# Patient Record
Sex: Male | Born: 1980 | Hispanic: No | Marital: Married | Smoking: Never smoker
Health system: Southern US, Community
[De-identification: ages and names within clinical notes are randomized; demographics above are authoritative.]

## PROBLEM LIST (undated history)

## (undated) DIAGNOSIS — G4730 Sleep apnea, unspecified: Secondary | ICD-10-CM

## (undated) DIAGNOSIS — Z87442 Personal history of urinary calculi: Secondary | ICD-10-CM

## (undated) HISTORY — PX: NO PAST SURGERIES: SHX2092

## (undated) HISTORY — PX: OTHER SURGICAL HISTORY: SHX169

---

## 2017-02-03 ENCOUNTER — Ambulatory Visit: Payer: Self-pay | Admitting: Podiatry

## 2017-03-19 ENCOUNTER — Other Ambulatory Visit: Payer: Self-pay | Admitting: Family Medicine

## 2017-03-19 DIAGNOSIS — R109 Unspecified abdominal pain: Secondary | ICD-10-CM

## 2017-03-24 ENCOUNTER — Ambulatory Visit
Admission: RE | Admit: 2017-03-24 | Discharge: 2017-03-24 | Disposition: A | Discharge status: Home / Self Care | Payer: Medicaid Other | Source: Ambulatory Visit | Attending: Family Medicine | Admitting: Family Medicine

## 2017-03-24 DIAGNOSIS — R109 Unspecified abdominal pain: Secondary | ICD-10-CM

## 2018-03-03 ENCOUNTER — Other Ambulatory Visit: Payer: Self-pay | Admitting: Gastroenterology

## 2018-03-03 DIAGNOSIS — K5909 Other constipation: Secondary | ICD-10-CM

## 2018-03-03 DIAGNOSIS — R109 Unspecified abdominal pain: Secondary | ICD-10-CM

## 2018-03-06 ENCOUNTER — Other Ambulatory Visit (HOSPITAL_BASED_OUTPATIENT_CLINIC_OR_DEPARTMENT_OTHER): Payer: Self-pay

## 2018-03-06 DIAGNOSIS — R0683 Snoring: Secondary | ICD-10-CM

## 2018-03-06 DIAGNOSIS — G4730 Sleep apnea, unspecified: Secondary | ICD-10-CM

## 2018-03-06 DIAGNOSIS — G4710 Hypersomnia, unspecified: Secondary | ICD-10-CM

## 2018-03-16 ENCOUNTER — Other Ambulatory Visit: Payer: Self-pay

## 2018-03-18 ENCOUNTER — Ambulatory Visit (HOSPITAL_BASED_OUTPATIENT_CLINIC_OR_DEPARTMENT_OTHER): Payer: No Typology Code available for payment source | Attending: Internal Medicine | Admitting: Internal Medicine

## 2018-03-18 VITALS — Ht 64.0 in | Wt 160.0 lb

## 2018-03-18 DIAGNOSIS — G4710 Hypersomnia, unspecified: Secondary | ICD-10-CM

## 2018-03-18 DIAGNOSIS — G4733 Obstructive sleep apnea (adult) (pediatric): Secondary | ICD-10-CM | POA: Insufficient documentation

## 2018-03-18 DIAGNOSIS — R0683 Snoring: Secondary | ICD-10-CM | POA: Diagnosis present

## 2018-03-18 DIAGNOSIS — G4730 Sleep apnea, unspecified: Secondary | ICD-10-CM

## 2018-03-18 DIAGNOSIS — R0681 Apnea, not elsewhere classified: Secondary | ICD-10-CM

## 2018-03-22 NOTE — Procedures (Signed)
   NAME: Jeff FeelerMajed T Carlo DATE OF BIRTH:  May 26, 1981 MEDICAL RECORD NUMBER [id number]  LOCATION: [facility]  PHYSICIAN: Deretha EmoryJames C Imojean Yoshino  DATE OF STUDY: 03/18/2018  SLEEP STUDY TYPE: Nocturnal Polysomnogram               REFERRING PHYSICIAN: Deretha Emorysborne, Loyola Santino C, MD  INDICATION FOR STUDY: excessive daytime sleepiness, witnessed apnea  EPWORTH SLEEPINESS SCORE:  NA HEIGHT: 5\' 4"  (162.6 cm)  WEIGHT: 160 lb (72.6 kg)    Body mass index is 27.46 kg/m.  NECK SIZE: 16 in.  MEDICATIONS  Patient self administered medications include: N/A. Medications administered during study include No sleep medicine administered.Marland Kitchen.   SLEEP STUDY TECHNIQUE  A multi-channel overnight Polysomnography study was performed. The channels recorded and monitored were central and occipital EEG, electrooculogram (EOG), submentalis EMG (chin), nasal and oral airflow, thoracic and abdominal wall motion, anterior tibialis EMG, snore microphone, electrocardiogram, and a pulse oximetry.   TECHNICAL COMMENTS  Comments added by Technician: NONE Comments added by Scorer: N/A   SLEEP ARCHITECTURE  The study was initiated at 9:48:54 PM and terminated at 5:00:16 AM. The total recorded time was 431.4 minutes. EEG confirmed total sleep time was 378 minutes yielding a sleep efficiency of 87.6%%. Sleep onset after lights out was 10.5 minutes with a REM latency of 5.0 minutes. The patient spent 9.1%% of the night in stage N1 sleep, 59.5%% in stage N2 sleep, 0.0%% in stage N3 and 31.35% in REM. Wake after sleep onset (WASO) was 42.8 minutes. The Arousal Index was 24.6/hour.   RESPIRATORY PARAMETERS  There were a total of 45 respiratory disturbances out of which 0 were apneas ( 0 obstructive, 0 mixed, 0 central) and 45 hypopneas. The apnea/hypopnea index (AHI) was 7.1 events/hour. The central sleep apnea index was 0.0 events/hour. The REM AHI was 19.2 events/hour. RDI was 15/hr overall and 29/hr in REM sleep.  Respiratory disturbances were associated with oxygen desaturation down to a nadir of 89.0% during sleep. The mean oxygen saturation during the study was 93.8%. The cumulative time under 88% oxygen saturation was 5.5 minutes.  LEG MOVEMENT DATA  The total leg movements were 0 with a resulting leg movement index of 0.0/hr . Associated arousal with leg movement index was 0.0/hr.   CARDIAC DATA  The underlying cardiac rhythm was most consistent with sinus rhythm. Mean heart rate during sleep was 80.0 bpm. Additional rhythm abnormalities include None.   IMPRESSIONS  Mild Obstructive Sleep apnea(OSA) No Significant Central Sleep Apnea (CSA) No significant periodic leg movements(PLMs) during sleep.  Early REM period (at 5 minutes of sleep)  DIAGNOSIS  Obstructive Sleep Apnea (327.23 [G47.33 ICD-10])  RECOMMENDATIONS  MIld obstructive sleep apnea. Consider treatment. However, if symptoms persistent, consider further testing due to presence of early REM period.   Deretha EmoryJames C Maham Quintin Sleep specialist, American Board of Internal Medicine  ELECTRONICALLY SIGNED ON:  03/22/2018, 3:17 PM [facility] PH: [phone]   FX: [phone] ACCREDITED BY THE AMERICAN ACADEMY OF SLEEP MEDICINE

## 2018-03-24 ENCOUNTER — Ambulatory Visit
Admission: RE | Admit: 2018-03-24 | Discharge: 2018-03-24 | Disposition: A | Discharge status: Home / Self Care | Payer: No Typology Code available for payment source | Source: Ambulatory Visit | Attending: Gastroenterology | Admitting: Gastroenterology

## 2018-03-24 DIAGNOSIS — R109 Unspecified abdominal pain: Secondary | ICD-10-CM

## 2018-03-24 DIAGNOSIS — K5909 Other constipation: Secondary | ICD-10-CM

## 2018-03-24 MED ORDER — IOPAMIDOL (ISOVUE-300) INJECTION 61%
100.0000 mL | Freq: Once | INTRAVENOUS | Status: AC | PRN
  Administered 2018-03-24: 100 mL via INTRAVENOUS

## 2018-05-12 MED FILL — DICYCLOMINE 10 MG CAPSULE: 10 | 15 days supply | Qty: 90 | Fill #0

## 2018-05-12 MED FILL — HYDROCORTISONE ACETATE 25 M: 25 | 30 days supply | Qty: 30 | Fill #0

## 2018-05-28 MED FILL — MARY'S MAGIC MOUTHWASH TCN: 12 days supply | Qty: 250 | Fill #0

## 2018-12-21 ENCOUNTER — Ambulatory Visit: Payer: Self-pay | Admitting: Surgery

## 2018-12-21 DIAGNOSIS — R1031 Right lower quadrant pain: Secondary | ICD-10-CM

## 2018-12-21 DIAGNOSIS — G8929 Other chronic pain: Secondary | ICD-10-CM | POA: Insufficient documentation

## 2018-12-21 DIAGNOSIS — R1010 Upper abdominal pain, unspecified: Secondary | ICD-10-CM | POA: Insufficient documentation

## 2018-12-21 DIAGNOSIS — K642 Third degree hemorrhoids: Secondary | ICD-10-CM

## 2018-12-21 DIAGNOSIS — K581 Irritable bowel syndrome with constipation: Secondary | ICD-10-CM

## 2018-12-21 DIAGNOSIS — K625 Hemorrhage of anus and rectum: Secondary | ICD-10-CM | POA: Insufficient documentation

## 2018-12-21 NOTE — H&P (Signed)
Jeff Meyer Documented: 12/21/2018 2:54 PM Location: [facility] Patient #: [id number] DOB: 1980-11-14 Married / Language: [place] / Race: Undefined Male  History of Present Illness [name](Jaesean Litzau C. Leticia Coletta MD; 12/21/2018 3:48 PM) The patient is a 38 year old male who presents with a complaint of Rectal bleeding. Note for "Rectal bleeding": ` ` ` Patient sent for surgical consultation at the request of Dr Levora AngelBrahmbhatt  Chief Complaint: Rectal bleeding ` ` The patient is a pleasant gentleman that struggle with hemorrhoids for many years. He had an episode of a thrombosed hemorrhoid requiring emergent lancing in the emergency room when he lived in [place], [place]. Later at fluoroscopy hemorrhoid surgery. This is an 2016. Struggles with irregular bowels. Tends to be constipated. Began to have persistent rectal bleeding and irritation. [facility] and also with [facility]ology in [place]. Had a colonoscopy that was normal aside from some internal hemorrhoids. Surgically consultation offered last year. That did not happen. He has been trying to manage it with suppositories and topical creams without much help. He does note he feels something popping out when he strains to move his bowels. It spontaneously reduces. There is documentation of a trial of diltiazem, but the patient does not recall this. Because of persistent rectal bleeding and hemorrhoids, patient was interested in considering hemorrhoid surgery now.  Patient also notes that he has right upper quadrant abdominal pain. Worse after eating, especially greasy foods. Had a CAT scan that was underwhelming. Ultrasound that showed no stones or sludge. He had a nuclear medicine HIDA scan done a year ago that showed ejection fraction of 40%, consider normal. Patient does not recall any symptoms at the time of the procedure. Sounds like gastrology offered a trial of an  antacid medications. Patient does not recall if he ever took that. Certainly he is not on it now. Eyes too much in the way of heartburn or reflux but does get bloating and fullness. Patient also notes now he gets right lower quadrant intermittent pain.Marland Kitchen. Perhaps its related to his constipation. He did have a CAT scan that revealed a small nonobstructing kidney stone on the left side when he had CAT scans and workup done in summer 2019. Patient has had issues of diarrhea on Lynn's S. Trimmed Lanse was offered. I don't know if the patient's really taking it. He occasionally takes Metamucil and claims he moves his bowels most days.  No personal nor family history of GI/colon cancer, inflammatory bowel disease, irritable bowel syndrome, allergy such as Celiac Sprue, dietary/dairy problems, colitis, ulcers nor gastritis. No recent sick contacts/gastroenteritis. No travel outside the country. No changes in diet. No significant heartburn or reflux. No hematemesis, coffee ground emesis. No evidence of prior gastric/peptic ulceration. He has not had an upper endoscopy nor any upper GI swallow studies  (Review of systems as stated in this history (HPI) or in the review of systems. Otherwise all other 12 point ROS are negative) ` ` `   Past Surgical History [name](Lori-Ann Lindfors C. Christeena Krogh, MD; 12/21/2018 3:48 PM) Hemorrhoidectomy [2016]: Incision and drainage of thrombosed hemorrhoid in ED. Then hemorrhoid surgery by surgeon in [place].  Allergies (Sabrina Canty, CMA; 12/21/2018 2:55 PM) No Known Allergies [12/21/2018]: No Known Drug Allergies [12/21/2018]: Allergies Reconciled  Medication History Kristen Cardinal(Sabrina Canty, CMA; 12/21/2018 2:55 PM) No Current Medications Medications Reconciled  Other Problems Kristen Cardinal(Sabrina Canty, CMA; 12/21/2018 2:54 PM) Hemorrhoids     Review of Systems Martie Lee(Sabrina Canty CMA; 12/21/2018 2:54 PM) General  Not Present- Appetite Loss, Chills, Fatigue, Fever, Night Sweats, Weight  Gain and Weight Loss. Skin Not Present- Change in Wart/Mole, Dryness, Hives, Jaundice, New Lesions, Non-Healing Wounds, Rash and Ulcer. HEENT Not Present- Earache, Hearing Loss, Hoarseness, Nose Bleed, Oral Ulcers, Ringing in the Ears, Seasonal Allergies, Sinus Pain, Sore Throat, Visual Disturbances, Wears glasses/contact lenses and Yellow Eyes. Respiratory Not Present- Bloody sputum, Chronic Cough, Difficulty Breathing, Snoring and Wheezing. Breast Not Present- Breast Mass, Breast Pain, Nipple Discharge and Skin Changes. Cardiovascular Not Present- Chest Pain, Difficulty Breathing Lying Down, Leg Cramps, Palpitations, Rapid Heart Rate, Shortness of Breath and Swelling of Extremities. Male Genitourinary Not Present- Blood in Urine, Change in Urinary Stream, Frequency, Impotence, Nocturia, Painful Urination, Urgency and Urine Leakage. Musculoskeletal Not Present- Back Pain, Joint Pain, Joint Stiffness, Muscle Pain, Muscle Weakness and Swelling of Extremities. Neurological Not Present- Decreased Memory, Fainting, Headaches, Numbness, Seizures, Tingling, Tremor, Trouble walking and Weakness. Psychiatric Not Present- Anxiety, Bipolar, Change in Sleep Pattern, Depression, Fearful and Frequent crying. Endocrine Not Present- Cold Intolerance, Excessive Hunger, Hair Changes, Heat Intolerance, Hot flashes and New Diabetes. Hematology Not Present- Blood Thinners, Easy Bruising, Excessive bleeding, Gland problems, HIV and Persistent Infections.  Vitals (Sabrina Canty CMA; 12/21/2018 2:55 PM) 12/21/2018 2:55 PM Weight: 158.4 lb Height: 64in Body Surface Area: 1.77 m Body Mass Index: 27.19 kg/m  Temp.: 98.14F(Oral)  Pulse: 104 (Regular)  P.OX: 96% (Room air) BP: 132/64 (Sitting, Left Arm, Standard)      Physical Exam [name] MD; 12/21/2018 3:24 PM)  General Mental Status-Alert. General Appearance-Not in acute distress, Not Sickly. Orientation-Oriented X3. Hydration-Well  hydrated. Voice-Normal.  Integumentary Global Assessment Upon inspection and palpation of skin surfaces of the - Axillae: non-tender, no inflammation or ulceration, no drainage. and Distribution of scalp and body hair is normal. General Characteristics Temperature - normal warmth is noted.  Head and Neck Head-normocephalic, atraumatic with no lesions or palpable masses. Face Global Assessment - atraumatic, no absence of expression. Neck Global Assessment - no abnormal movements, no bruit auscultated on the right, no bruit auscultated on the left, no decreased range of motion, non-tender. Trachea-midline. Thyroid Gland Characteristics - non-tender.  Eye Eyeball - Left-Extraocular movements intact, No Nystagmus. Eyeball - Right-Extraocular movements intact, No Nystagmus. Cornea - Left-No Hazy. Cornea - Right-No Hazy. Sclera/Conjunctiva - Left-No scleral icterus, No Discharge. Sclera/Conjunctiva - Right-No scleral icterus, No Discharge. Pupil - Left-Direct reaction to light normal. Pupil - Right-Direct reaction to light normal.  ENMT Ears Pinna - Left - no drainage observed, no generalized tenderness observed. Right - no drainage observed, no generalized tenderness observed. Nose and Sinuses External Inspection of the Nose - no destructive lesion observed. Inspection of the nares - Left - quiet respiration. Right - quiet respiration. Mouth and Throat Lips - Upper Lip - no fissures observed, no pallor noted. Lower Lip - no fissures observed, no pallor noted. Nasopharynx - no discharge present. Oral Cavity/Oropharynx - Tongue - no dryness observed. Oral Mucosa - no cyanosis observed. Hypopharynx - no evidence of airway distress observed.  Chest and Lung Exam Inspection Movements - Normal and Symmetrical. Accessory muscles - No use of accessory muscles in breathing. Palpation Palpation of the chest reveals - Non-tender. Auscultation Breath sounds - Normal  and Clear.  Cardiovascular Auscultation Rhythm - Regular. Murmurs & Other Heart Sounds - Auscultation of the heart reveals - No Murmurs and No Systolic Clicks.  Abdomen Inspection Inspection of the abdomen reveals - No Visible peristalsis and No Abnormal pulsations. Umbilicus - No  Bleeding, No Urine drainage. Palpation/Percussion Palpation and Percussion of the abdomen reveal - Soft, Non Tender, No Rebound tenderness, No Rigidity (guarding) and No Cutaneous hyperesthesia. Note: Abdomen soft. Mild epigastric discomfort. No Murphy sign. Mild right lower quadrant discomfort not at McBurney's point. No guarding or rebound tenderness. Probable very small umbilical hernia at the base of the stalk. Present on Valsalva only. Not distended. No incisional hernias. No guarding.  Male Genitourinary Sexual Maturity Tanner 5 - Adult hair pattern and Adult penile size and shape. Note: No inguinal hernias. Normal external genitalia. Epididymi, testes, and spermatic cords normal without any masses. Shotty inguinal lymph nodes but nothing distinctive  Rectal Note: Please refer to anoscopy section.  Right posterior inflamed hemorrhoid friable. At least grade 2, probably grade 3. Left lateral grade 2. Right anterior grade 1-2.  No anal fissure. No abscess. No fistula. No obvious tumors. No pilonidal disease. No pruritus. No condyloma.  Peripheral Vascular Upper Extremity Inspection - Left - No Cyanotic nailbeds, Not Ischemic. Right - No Cyanotic nailbeds, Not Ischemic.  Neurologic Neurologic evaluation reveals -normal attention span and ability to concentrate, able to name objects and repeat phrases. Appropriate fund of knowledge , normal sensation and normal coordination. Mental Status Affect - not angry, not paranoid. Cranial Nerves-Normal Bilaterally. Gait-Normal.  Neuropsychiatric Mental status exam performed with findings of-able to articulate well with normal speech/language,  rate, volume and coherence, thought content normal with ability to perform basic computations and apply abstract reasoning and no evidence of hallucinations, delusions, obsessions or homicidal/suicidal ideation.  Musculoskeletal Global Assessment Spine, Ribs and Pelvis - no instability, subluxation or laxity. Right Upper Extremity - no instability, subluxation or laxity.  Lymphatic Head & Neck  General Head & Neck Lymphatics: Bilateral - Description - No Localized lymphadenopathy. Axillary  General Axillary Region: Bilateral - Description - No Localized lymphadenopathy. Femoral & Inguinal  Generalized Femoral & Inguinal Lymphatics: Left - Description - No Localized lymphadenopathy. Right - Description - No Localized lymphadenopathy.    Assessment & Plan [name] MD; 12/21/2018 3:48 PM)  PROLAPSED INTERNAL HEMORRHOIDS, GRADE 3 (K64.2) Impression: Obvious right posterior prolapsing hemorrhoid friable irritating. Source of bleeding. Underwhelming colonoscopy done last year. I think he would benefit from surgery. Hemorrhoidal ligation attacks seem probable hemorrhoidectomy. Help address his other 2 piles, especially left lateral wound which is enlarged as well.  Ultimately this is a side effect of his irregular bowels. Recommend he follow closely with his gastroenterologist with a bowel regimen to help normalize his bowels and avoid future stresses and future hemorrhoid prior flares. Take the Metamucil every day. Otherwise, he's going to get a third episode of needing intervention   PROLAPSED INTERNAL HEMORRHOIDS, GRADE 2 (K64.1) Impression: Left lateral and right anterior grade 2 hemorrhoids. Hopefully can be managed with hemorrhoidal ligation/pexy   INTERNAL BLEEDING HEMORRHOIDS (K64.8)  Current Plans ANOSCOPY, DIAGNOSTIC (96045) Pt Education - [facility] Hemorrhoids (Cortlan Dolin): discussed with patient and provided information. Pt Education - Pamphlet Given - The Hemorrhoid Book:  discussed with patient and provided information.  ENCOUNTER FOR PREOPERATIVE EXAMINATION FOR GENERAL SURGICAL PROCEDURE (Z01.818)  Current Plans You are being scheduled for surgery- Our schedulers will call you.  You should hear from our office's scheduling department within 5 working days about the location, date, and time of surgery. We try to make accommodations for patient's preferences in scheduling surgery, but sometimes the OR schedule or the surgeon's schedule prevents [place] from making those accommodations.  If you have not heard from our office [phone])  in 5 working days, call the office and ask for your surgeon's nurse.  If you have other questions about your diagnosis, plan, or surgery, call the office and ask for your surgeon's nurse.  Pt Education - [facility] Rectal Prep for Anorectal outpatient/office surgery: discussed with patient and provided information. Pt Education - [facility] Rectal Surgery HCI (Zerina Hallinan): discussed with patient and provided information.  UPPER ABDOMINAL PAIN OF UNKNOWN ETIOLOGY (R10.10) Impression: Postprandial pain and discomfort of uncertain etiology. Underwhelming CAT scan, ultrasound, HIDA scan. Normal gallbladder ejection fraction without reproduction of symptoms. Some fatty infiltration of the liver.  It is reasonable to do a trial of antacid medications. I believe Dr. Levora Angel had suggested that last year.  Obtain gastric emptying study to rule out delayed gastric emptying as an etiology. Would not immediately suspect that in a nondiabetic patient not on chronic narcotics, but he is a little further down the workup. Perhaps consider EGD of persistent. Defer to his gastroenterologist.   ABDOMINAL PAIN, RIGHT LOWER QUADRANT (R10.31) Impression: Right lower quadrant pain of uncertain etiology. No evidence of any inguinal or Spigelian hernia. No appendicitis, Crohn's, obstruction. He does have a kidney stone but it was on the left side, NOT on the right. I  did note that patients with chronic constipation and irregular bowels often struggle with intermittent abdominal pain. Normalize his bowels, and that should help. If a persistent issue with improved bowel function, defer back to gastroenterology.  [name], MD, FACS, MASCRS Gastrointestinal and Minimally Invasive Surgery    [address] [place], [place] [address] [phone] Main / Paging [phone] Fax

## 2018-12-25 ENCOUNTER — Other Ambulatory Visit: Payer: Self-pay | Admitting: Surgery

## 2018-12-25 ENCOUNTER — Other Ambulatory Visit (HOSPITAL_COMMUNITY): Payer: Self-pay | Admitting: Surgery

## 2018-12-25 DIAGNOSIS — R1031 Right lower quadrant pain: Secondary | ICD-10-CM

## 2019-01-13 ENCOUNTER — Encounter (HOSPITAL_COMMUNITY): Payer: No Typology Code available for payment source

## 2019-01-29 ENCOUNTER — Encounter (HOSPITAL_COMMUNITY): Payer: Self-pay

## 2019-01-29 ENCOUNTER — Ambulatory Visit (HOSPITAL_COMMUNITY): Payer: No Typology Code available for payment source

## 2019-03-22 ENCOUNTER — Other Ambulatory Visit: Payer: Self-pay | Admitting: Physician Assistant

## 2019-03-22 DIAGNOSIS — R319 Hematuria, unspecified: Secondary | ICD-10-CM

## 2019-03-22 DIAGNOSIS — R1032 Left lower quadrant pain: Secondary | ICD-10-CM

## 2019-05-03 ENCOUNTER — Ambulatory Visit
Admission: RE | Admit: 2019-05-03 | Discharge: 2019-05-03 | Disposition: A | Discharge status: Home / Self Care | Payer: No Typology Code available for payment source | Source: Ambulatory Visit | Attending: Physician Assistant | Admitting: Physician Assistant

## 2019-05-03 DIAGNOSIS — R319 Hematuria, unspecified: Secondary | ICD-10-CM

## 2019-05-03 DIAGNOSIS — R1032 Left lower quadrant pain: Secondary | ICD-10-CM

## 2019-05-14 ENCOUNTER — Other Ambulatory Visit: Payer: Self-pay | Admitting: Urology

## 2019-05-16 ENCOUNTER — Other Ambulatory Visit: Payer: Self-pay | Admitting: Urology

## 2019-05-17 ENCOUNTER — Encounter (HOSPITAL_COMMUNITY)
Admission: AD | Disposition: A | Discharge status: Home / Self Care | Payer: Self-pay | Source: Ambulatory Visit | Attending: Urology

## 2019-05-17 ENCOUNTER — Ambulatory Visit (HOSPITAL_COMMUNITY): Payer: No Typology Code available for payment source

## 2019-05-17 ENCOUNTER — Ambulatory Visit (HOSPITAL_COMMUNITY)
Admission: AD | Admit: 2019-05-17 | Discharge: 2019-05-17 | Disposition: A | Discharge status: Home / Self Care | Payer: No Typology Code available for payment source | Source: Ambulatory Visit | Attending: Urology | Admitting: Urology

## 2019-05-17 ENCOUNTER — Other Ambulatory Visit: Payer: Self-pay

## 2019-05-17 ENCOUNTER — Encounter (HOSPITAL_COMMUNITY): Payer: Self-pay | Admitting: *Deleted

## 2019-05-17 DIAGNOSIS — Z91013 Allergy to seafood: Secondary | ICD-10-CM | POA: Insufficient documentation

## 2019-05-17 DIAGNOSIS — N201 Calculus of ureter: Secondary | ICD-10-CM | POA: Diagnosis not present

## 2019-05-17 DIAGNOSIS — Z79899 Other long term (current) drug therapy: Secondary | ICD-10-CM | POA: Insufficient documentation

## 2019-05-17 HISTORY — DX: Sleep apnea, unspecified: G47.30

## 2019-05-17 HISTORY — PX: EXTRACORPOREAL SHOCK WAVE LITHOTRIPSY: SHX1557

## 2019-05-17 HISTORY — DX: Personal history of urinary calculi: Z87.442

## 2019-05-17 SURGERY — LITHOTRIPSY, ESWL
Anesthesia: LOCAL | Laterality: Left

## 2019-05-17 MED ORDER — CIPROFLOXACIN HCL 500 MG PO TABS
500.0000 mg | ORAL_TABLET | ORAL | Status: AC
  Administered 2019-05-17: 15:00:00 500 mg via ORAL
  Filled 2019-05-17: qty 1

## 2019-05-17 MED ORDER — DIAZEPAM 5 MG PO TABS
10.0000 mg | ORAL_TABLET | ORAL | Status: AC
  Administered 2019-05-17: 10 mg via ORAL
  Filled 2019-05-17: qty 2

## 2019-05-17 MED ORDER — DIPHENHYDRAMINE HCL 25 MG PO CAPS
25.0000 mg | ORAL_CAPSULE | ORAL | Status: AC
  Administered 2019-05-17: 25 mg via ORAL
  Filled 2019-05-17: qty 1

## 2019-05-17 MED ORDER — SODIUM CHLORIDE 0.9 % IV SOLN
INTRAVENOUS | Status: DC
  Administered 2019-05-17: 15:00:00 via INTRAVENOUS

## 2019-05-17 SURGICAL SUPPLY — 4 items
COVER SURGICAL LIGHT HANDLE (MISCELLANEOUS) ×2 IMPLANT
COVER WAND RF STERILE (DRAPES) IMPLANT
KIT TURNOVER KIT A (KITS) IMPLANT
TOWEL OR 17X26 10 PK STRL BLUE (TOWEL DISPOSABLE) ×2 IMPLANT

## 2019-05-17 NOTE — Discharge Instructions (Signed)
See [facility] discharge instructions in chart.  

## 2019-05-17 NOTE — Op Note (Signed)
See [facility] note scanned into chart. 

## 2019-05-17 NOTE — Interval H&P Note (Signed)
History and Physical Interval Note:  05/17/2019 5:07 PM  Jeff Meyer  has presented today for surgery, with the diagnosis of LEFT URETERAL STONE.  The various methods of treatment have been discussed with the patient and family. After consideration of risks, benefits and other options for treatment, the patient has consented to  Procedure(s): EXTRACORPOREAL SHOCK WAVE LITHOTRIPSY (ESWL) (Left) as a surgical intervention.  The patient's history has been reviewed, patient examined, no change in status, stable for surgery.  I have reviewed the patient's chart and labs.  Questions were answered to the patient's satisfaction.     Lillette Boxer Brayley Mackowiak

## 2019-05-17 NOTE — H&P (Signed)
H&P  Chief Complaint: Kidney stone  History of Present Illness: Symptomatic left mid/distal ureteral stone.  No past medical history on file.    Home Medications:  Allergies as of 05/17/2019   Not on File     Medication List    Notice   Cannot display discharge medications because the patient has not yet been admitted.     Allergies: Not on File  No family history on file.  Social History:  has no history on file for tobacco, alcohol, and drug.  ROS: A complete review of systems was performed.  All systems are negative except for pertinent findings as noted.  Physical Exam:  Vital signs in last 24 hours: BP: ()/()  Arterial Line BP: ()/()  Constitutional:  Alert and oriented, No acute distress Cardiovascular: Regular rate  Respiratory: Normal respiratory effort GI: Abdomen is soft, nontender, nondistended, no abdominal masses. No CVAT.  Genitourinary: Normal male phallus, testes are descended bilaterally and non-tender and without masses, scrotum is normal in appearance without lesions or masses, perineum is normal on inspection. Lymphatic: No lymphadenopathy Neurologic: Grossly intact, no focal deficits Psychiatric: Normal mood and affect  Laboratory Data:  No results for input(s): WBC, HGB, HCT, PLT in the last 72 hours.  No results for input(s): NA, K, CL, GLUCOSE, BUN, CALCIUM, CREATININE in the last 72 hours.  Invalid input(s): CO3   No results found for this or any previous visit (from the past 24 hour(s)). No results found for this or any previous visit (from the past 240 hour(s)).  Renal Function: No results for input(s): CREATININE in the last 168 hours. CrCl cannot be calculated (No successful lab value found.).  Radiologic Imaging: No results found.  Impression/Assessment:  Left mid/distal ureteral stone  Plan:  Left ESL. May be part of a staged procedure.

## 2019-05-18 ENCOUNTER — Encounter (HOSPITAL_COMMUNITY): Payer: Self-pay | Admitting: Urology

## 2020-02-28 ENCOUNTER — Ambulatory Visit: Payer: Self-pay | Admitting: Surgery

## 2020-03-23 ENCOUNTER — Other Ambulatory Visit: Payer: Self-pay | Admitting: Family Medicine

## 2020-03-23 DIAGNOSIS — R1011 Right upper quadrant pain: Secondary | ICD-10-CM

## 2020-03-28 ENCOUNTER — Ambulatory Visit
Admission: RE | Admit: 2020-03-28 | Discharge: 2020-03-28 | Disposition: A | Discharge status: Home / Self Care | Payer: Medicaid Other | Source: Ambulatory Visit | Attending: Family Medicine | Admitting: Family Medicine

## 2020-03-28 DIAGNOSIS — R1011 Right upper quadrant pain: Secondary | ICD-10-CM

## 2020-05-19 ENCOUNTER — Emergency Department (HOSPITAL_COMMUNITY)
Admission: EM | Admit: 2020-05-19 | Discharge: 2020-05-19 | Disposition: A | Discharge status: Home / Self Care | Payer: Medicaid Other | Attending: Emergency Medicine | Admitting: Emergency Medicine

## 2020-05-19 ENCOUNTER — Other Ambulatory Visit: Payer: Self-pay

## 2020-05-19 ENCOUNTER — Encounter (HOSPITAL_COMMUNITY): Payer: Self-pay | Admitting: Emergency Medicine

## 2020-05-19 DIAGNOSIS — Z5321 Procedure and treatment not carried out due to patient leaving prior to being seen by health care provider: Secondary | ICD-10-CM | POA: Diagnosis not present

## 2020-05-19 DIAGNOSIS — R339 Retention of urine, unspecified: Secondary | ICD-10-CM | POA: Insufficient documentation

## 2020-05-19 LAB — COMPREHENSIVE METABOLIC PANEL
ALT: 26 U/L (ref 0–44)
AST: 22 U/L (ref 15–41)
Albumin: 4.1 g/dL (ref 3.5–5.0)
Alkaline Phosphatase: 56 U/L (ref 38–126)
Anion gap: 9 (ref 5–15)
BUN: <5 mg/dL — ABNORMAL LOW (ref 6–20)
CO2: 28 mmol/L (ref 22–32)
Calcium: 9 mg/dL (ref 8.9–10.3)
Chloride: 100 mmol/L (ref 98–111)
Creatinine, Ser: 0.95 mg/dL (ref 0.61–1.24)
GFR calc Af Amer: >60 mL/min (ref >60)
GFR calc non Af Amer: >60 mL/min (ref >60)
Glucose, Bld: 108 mg/dL — ABNORMAL HIGH (ref 70–99)
Potassium: 4 mmol/L (ref 3.5–5.1)
Sodium: 137 mmol/L (ref 135–145)
Total Bilirubin: 0.8 mg/dL (ref 0.3–1.2)
Total Protein: 6.9 g/dL (ref 6.5–8.1)

## 2020-05-19 LAB — URINALYSIS, ROUTINE W REFLEX MICROSCOPIC
Bilirubin Urine: NEGATIVE
Glucose, UA: NEGATIVE mg/dL
Hgb urine dipstick: NEGATIVE
Ketones, ur: NEGATIVE mg/dL
Leukocytes,Ua: NEGATIVE
Nitrite: NEGATIVE
Protein, ur: NEGATIVE mg/dL
Specific Gravity, Urine: 1.009 (ref 1.005–1.030)
pH: 6 (ref 5.0–8.0)

## 2020-05-19 LAB — CBC
HCT: 48.8 % (ref 39.0–52.0)
Hemoglobin: 15.4 g/dL (ref 13.0–17.0)
MCH: 27.8 pg (ref 26.0–34.0)
MCHC: 31.6 g/dL (ref 30.0–36.0)
MCV: 88.2 fL (ref 80.0–100.0)
Platelets: 211 10*3/uL (ref 150–400)
RBC: 5.53 MIL/uL (ref 4.22–5.81)
RDW: 13.5 % (ref 11.5–15.5)
WBC: 11 10*3/uL — ABNORMAL HIGH (ref 4.0–10.5)
nRBC: 0 % (ref 0.0–0.2)

## 2020-05-19 LAB — LIPASE, BLOOD: Lipase: 19 U/L (ref 11–51)

## 2020-05-19 MED ORDER — OXYCODONE-ACETAMINOPHEN 5-325 MG PO TABS
1.0000 | ORAL_TABLET | ORAL | Status: DC | PRN
  Administered 2020-05-19: 1 via ORAL
  Filled 2020-05-19: qty 1

## 2020-05-19 NOTE — ED Notes (Signed)
Patient tolerated in and out well with nurse and Dwana Curd nurse tech. Patient abdominal distention states feels better 0/10. Rectum from surgery 3/10 pressure dull sharp.

## 2020-05-19 NOTE — ED Notes (Signed)
Tech will obtain bladder scanner for patient in triage.

## 2020-05-19 NOTE — ED Notes (Signed)
Pt didn't answer when called for vitals  °

## 2020-05-19 NOTE — ED Notes (Signed)
Called no answer for repeat vitals

## 2020-05-19 NOTE — ED Triage Notes (Signed)
Patient had Hemorid surgery 05/15/20 at [facility]. States yesterday unable to urinate. States last urinated and BM yesterday morning around 0800.

## 2020-05-20 LAB — URINE CULTURE: Culture: NO GROWTH

## 2020-06-30 ENCOUNTER — Other Ambulatory Visit: Payer: Self-pay | Admitting: Physician Assistant

## 2020-06-30 ENCOUNTER — Ambulatory Visit
Admission: RE | Admit: 2020-06-30 | Discharge: 2020-06-30 | Disposition: A | Discharge status: Home / Self Care | Payer: Medicaid Other | Source: Ambulatory Visit | Attending: Physician Assistant | Admitting: Physician Assistant

## 2020-06-30 DIAGNOSIS — M545 Low back pain, unspecified: Secondary | ICD-10-CM

## 2020-11-16 ENCOUNTER — Ambulatory Visit
Admission: RE | Admit: 2020-11-16 | Discharge: 2020-11-16 | Disposition: A | Discharge status: Home / Self Care | Payer: Medicaid Other | Source: Ambulatory Visit | Attending: Family Medicine | Admitting: Family Medicine

## 2020-11-16 ENCOUNTER — Other Ambulatory Visit: Payer: Self-pay

## 2020-11-16 ENCOUNTER — Ambulatory Visit (INDEPENDENT_AMBULATORY_CARE_PROVIDER_SITE_OTHER): Payer: Medicaid Other

## 2020-11-16 VITALS — BP 107/74 | HR 84 | Temp 98.0°F | Resp 18

## 2020-11-16 DIAGNOSIS — M79645 Pain in left finger(s): Secondary | ICD-10-CM

## 2020-11-16 DIAGNOSIS — S6722XA Crushing injury of left hand, initial encounter: Secondary | ICD-10-CM | POA: Diagnosis not present

## 2020-11-16 MED ORDER — IBUPROFEN 800 MG PO TABS: 800.0000 mg | ORAL_TABLET | Freq: Three times a day (TID) | ORAL | 0 refills | Status: DC

## 2020-11-16 MED ORDER — IBUPROFEN 800 MG PO TABS
800.0000 mg | ORAL_TABLET | Freq: Once | ORAL | Status: AC
  Administered 2020-11-16: 800 mg via ORAL

## 2020-11-16 NOTE — Discharge Instructions (Addendum)
There were no broken bones on your finger x-ray today. Wear the splint provided as needed for comfort.

## 2020-11-16 NOTE — ED Provider Notes (Signed)
[facility]   [id number] 11/16/20 Arrival Time: 1655  ASSESSMENT & PLAN:  1. Pain in finger of left hand     I have personally viewed the imaging studies ordered this visit. No fracture appreciated.   Meds ordered this encounter  Medications  . ibuprofen (ADVIL) tablet 800 mg    Orders Placed This Encounter  Procedures  . DG Finger Index Left  . Apply finger splint static    Recommend:    Follow-up Information    Scifres, Nicole Cella, [place].   Specialty: Physician Assistant Why: As needed. Contact information: [address] [place] [address] [phone]               Meds ordered this encounter  Medications  . ibuprofen (ADVIL) tablet 800 mg  . ibuprofen (ADVIL) 800 MG tablet    Sig: Take 1 tablet (800 mg total) by mouth 3 (three) times daily with meals.    Dispense:  21 tablet    Refill:  0    Reviewed expectations re: course of current medical issues. Questions answered. Outlined signs and symptoms indicating need for more acute intervention. Patient verbalized understanding. After Visit Summary given.  SUBJECTIVE: History from: patient. Jeff Meyer is a 40 y.o. male who reports crush injury to distal 2nd left finger; today; caught between heavy chairs; pulled free with small cut over volar distal finger. "Throbbing pain" currently. He has not experienced any significant side effects of this medication. No OTC analgesics.  Past Surgical History:  Procedure Laterality Date  . EXTRACORPOREAL SHOCK WAVE LITHOTRIPSY Left 05/17/2019   Procedure: EXTRACORPOREAL SHOCK WAVE LITHOTRIPSY (ESWL);  Surgeon: Marcine Matar, MD;  Location: [facility];  Service: Urology;  Laterality: Left;  . NO PAST SURGERIES    . OTHER SURGICAL HISTORY     Rectum       OBJECTIVE:  Vitals:   11/16/20 1711  BP: 107/74  Pulse: 84  Resp: 18  Temp: 98 F (36.7 C)  TempSrc: Oral  SpO2: 95%    General appearance: alert; no distress HEENT: Garza-[place] II;  AT Neck: supple with FROM Resp: unlabored respirations Extremities: . L 2nd finger: warm with well perfused appearance; poorly localized moderate tenderness over DIP joint; without gross deformities; swelling: minimal; bruising: none; normal ROM with discomfort CV: brisk extremity capillary refill of LUE; 2+ radial pulse of LUE. Skin: warm and dry; no visible rashes Neurologic: gait normal; normal sensation and strength of LUE Psychological: alert and cooperative; normal mood and affect  Imaging: DG Finger Index Left  Result Date: 11/16/2020 CLINICAL DATA:  Distal small finger crush injury earlier today. EXAM: LEFT INDEX FINGER 2+V COMPARISON:  None. FINDINGS: There is no evidence of fracture or dislocation. There is no evidence of arthropathy or other focal bone abnormality. Soft tissues are unremarkable. IMPRESSION: Negative. Electronically Signed   By: Obie Dredge M.D.   On: 11/16/2020 17:25      Allergies  Allergen Reactions  . Shellfish Allergy Other (See Comments)    Nausea and swelling of the lips     Past Medical History:  Diagnosis Date  . History of kidney stones   . Sleep apnea    Social History   Socioeconomic History  . Marital status: Married    Spouse name: Not on file  . Number of children: Not on file  . Years of education: Not on file  . Highest education level: Not on file  Occupational History  . Not on file  Tobacco Use  . Smoking status: Never Smoker  . Smokeless tobacco: Never Used  Vaping Use  . Vaping Use: Never used  Substance and Sexual Activity  . Alcohol use: Never  . Drug use: Never  . Sexual activity: Not on file  Other Topics Concern  . Not on file  Social History Narrative  . Not on file   Social Determinants of Health   Financial Resource Strain: Not on file  Food Insecurity: Not on file  Transportation Needs: Not on file  Physical Activity: Not on file  Stress: Not on file  Social Connections: Not on file   No  family history on file. Past Surgical History:  Procedure Laterality Date  . EXTRACORPOREAL SHOCK WAVE LITHOTRIPSY Left 05/17/2019   Procedure: EXTRACORPOREAL SHOCK WAVE LITHOTRIPSY (ESWL);  Surgeon: Marcine Matar, MD;  Location: [facility];  Service: Urology;  Laterality: Left;  . NO PAST SURGERIES    . OTHER SURGICAL HISTORY     Rectum       Mardella Layman, MD 11/16/20 [phone]

## 2020-11-16 NOTE — ED Triage Notes (Signed)
LT index finger got smashed in a chair today.  Pain, bruising and small cut on top of finger.

## 2021-01-01 ENCOUNTER — Encounter (HOSPITAL_COMMUNITY): Payer: Self-pay | Admitting: Pharmacy Technician

## 2021-01-01 ENCOUNTER — Emergency Department (HOSPITAL_COMMUNITY): Payer: Medicaid Other

## 2021-01-01 ENCOUNTER — Emergency Department (HOSPITAL_COMMUNITY)
Admission: EM | Admit: 2021-01-01 | Discharge: 2021-01-01 | Disposition: A | Discharge status: Home / Self Care | Payer: Medicaid Other | Attending: Emergency Medicine | Admitting: Emergency Medicine

## 2021-01-01 DIAGNOSIS — T07XXXA Unspecified multiple injuries, initial encounter: Secondary | ICD-10-CM

## 2021-01-01 DIAGNOSIS — S199XXA Unspecified injury of neck, initial encounter: Secondary | ICD-10-CM | POA: Diagnosis present

## 2021-01-01 DIAGNOSIS — S20319A Abrasion of unspecified front wall of thorax, initial encounter: Secondary | ICD-10-CM | POA: Diagnosis not present

## 2021-01-01 DIAGNOSIS — S50811A Abrasion of right forearm, initial encounter: Secondary | ICD-10-CM | POA: Diagnosis not present

## 2021-01-01 DIAGNOSIS — S161XXA Strain of muscle, fascia and tendon at neck level, initial encounter: Secondary | ICD-10-CM

## 2021-01-01 DIAGNOSIS — Y92410 Unspecified street and highway as the place of occurrence of the external cause: Secondary | ICD-10-CM | POA: Insufficient documentation

## 2021-01-01 DIAGNOSIS — S60511A Abrasion of right hand, initial encounter: Secondary | ICD-10-CM | POA: Insufficient documentation

## 2021-01-01 LAB — CBC WITH DIFFERENTIAL/PLATELET
Abs Immature Granulocytes: 0.04 10*3/uL (ref 0.00–0.07)
Basophils Absolute: 0 10*3/uL (ref 0.0–0.1)
Basophils Relative: 0 %
Eosinophils Absolute: 0.1 10*3/uL (ref 0.0–0.5)
Eosinophils Relative: 2 %
HCT: 48.2 % (ref 39.0–52.0)
Hemoglobin: 15.6 g/dL (ref 13.0–17.0)
Immature Granulocytes: 1 %
Lymphocytes Relative: 28 %
Lymphs Abs: 2.2 10*3/uL (ref 0.7–4.0)
MCH: 28.8 pg (ref 26.0–34.0)
MCHC: 32.4 g/dL (ref 30.0–36.0)
MCV: 89.1 fL (ref 80.0–100.0)
Monocytes Absolute: 0.4 10*3/uL (ref 0.1–1.0)
Monocytes Relative: 6 %
Neutro Abs: 5.1 10*3/uL (ref 1.7–7.7)
Neutrophils Relative %: 63 %
Platelets: 171 10*3/uL (ref 150–400)
RBC: 5.41 MIL/uL (ref 4.22–5.81)
RDW: 12.6 % (ref 11.5–15.5)
WBC: 7.9 10*3/uL (ref 4.0–10.5)
nRBC: 0 % (ref 0.0–0.2)

## 2021-01-01 LAB — I-STAT CHEM 8, ED
BUN: 18 mg/dL (ref 6–20)
Calcium, Ion: 1.12 mmol/L — ABNORMAL LOW (ref 1.15–1.40)
Chloride: 105 mmol/L (ref 98–111)
Creatinine, Ser: 0.9 mg/dL (ref 0.61–1.24)
Glucose, Bld: 132 mg/dL — ABNORMAL HIGH (ref 70–99)
HCT: 46 % (ref 39.0–52.0)
Hemoglobin: 15.6 g/dL (ref 13.0–17.0)
Potassium: 3.7 mmol/L (ref 3.5–5.1)
Sodium: 141 mmol/L (ref 135–145)
TCO2: 26 mmol/L (ref 22–32)

## 2021-01-01 MED ORDER — CYCLOBENZAPRINE HCL 10 MG PO TABS: 10.0000 mg | ORAL_TABLET | Freq: Two times a day (BID) | ORAL | 0 refills | Status: AC | PRN

## 2021-01-01 MED ORDER — BACITRACIN ZINC 500 UNIT/GM EX OINT
TOPICAL_OINTMENT | Freq: Two times a day (BID) | CUTANEOUS | Status: DC
  Administered 2021-01-01: 1 via TOPICAL

## 2021-01-01 MED ORDER — NAPROXEN 500 MG PO TABS: 500.0000 mg | ORAL_TABLET | Freq: Two times a day (BID) | ORAL | 0 refills | Status: AC
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
## 2021-11-11 ENCOUNTER — Emergency Department (HOSPITAL_COMMUNITY)
Admission: EM | Admit: 2021-11-11 | Discharge: 2021-11-11 | Disposition: A | Discharge status: Home / Self Care | Payer: Medicaid Other | Attending: Emergency Medicine | Admitting: Emergency Medicine

## 2021-11-11 ENCOUNTER — Other Ambulatory Visit: Payer: Self-pay

## 2021-11-11 ENCOUNTER — Emergency Department (HOSPITAL_COMMUNITY): Payer: Medicaid Other

## 2021-11-11 ENCOUNTER — Encounter (HOSPITAL_COMMUNITY): Payer: Self-pay

## 2021-11-11 DIAGNOSIS — R109 Unspecified abdominal pain: Secondary | ICD-10-CM | POA: Diagnosis present

## 2021-11-11 DIAGNOSIS — N201 Calculus of ureter: Secondary | ICD-10-CM | POA: Diagnosis not present

## 2021-11-11 DIAGNOSIS — R000 Tachycardia, unspecified: Secondary | ICD-10-CM | POA: Diagnosis not present

## 2021-11-11 LAB — URINALYSIS, ROUTINE W REFLEX MICROSCOPIC
Bilirubin Urine: NEGATIVE
Glucose, UA: NEGATIVE mg/dL
Ketones, ur: 5 mg/dL — AB
Nitrite: NEGATIVE
Protein, ur: 30 mg/dL — AB
RBC / HPF: >50 RBC/hpf — ABNORMAL HIGH (ref 0–5)
Specific Gravity, Urine: 1.032 — ABNORMAL HIGH (ref 1.005–1.030)
pH: 5 (ref 5.0–8.0)

## 2021-11-11 LAB — BASIC METABOLIC PANEL
Anion gap: 10 (ref 5–15)
BUN: 16 mg/dL (ref 6–20)
CO2: 24 mmol/L (ref 22–32)
Calcium: 9.4 mg/dL (ref 8.9–10.3)
Chloride: 98 mmol/L (ref 98–111)
Creatinine, Ser: 1.38 mg/dL — ABNORMAL HIGH (ref 0.61–1.24)
GFR, Estimated: >60 mL/min (ref >60)
Glucose, Bld: 138 mg/dL — ABNORMAL HIGH (ref 70–99)
Potassium: 4.2 mmol/L (ref 3.5–5.1)
Sodium: 132 mmol/L — ABNORMAL LOW (ref 135–145)

## 2021-11-11 LAB — CBC
HCT: 50.8 % (ref 39.0–52.0)
Hemoglobin: 16.9 g/dL (ref 13.0–17.0)
MCH: 29.2 pg (ref 26.0–34.0)
MCHC: 33.3 g/dL (ref 30.0–36.0)
MCV: 87.9 fL (ref 80.0–100.0)
Platelets: 199 10*3/uL (ref 150–400)
RBC: 5.78 MIL/uL (ref 4.22–5.81)
RDW: 13 % (ref 11.5–15.5)
WBC: 12.1 10*3/uL — ABNORMAL HIGH (ref 4.0–10.5)
nRBC: 0 % (ref 0.0–0.2)

## 2021-11-11 MED ORDER — ONDANSETRON HCL 4 MG/2ML IJ SOLN
4.0000 mg | Freq: Once | INTRAMUSCULAR | Status: AC
  Administered 2021-11-11: 4 mg via INTRAVENOUS
  Filled 2021-11-11: qty 2

## 2021-11-11 MED ORDER — TAMSULOSIN HCL 0.4 MG PO CAPS: 0.4000 mg | ORAL_CAPSULE | Freq: Every day | ORAL | 0 refills | Status: AC

## 2021-11-11 MED ORDER — SODIUM CHLORIDE 0.9 % IV BOLUS
1000.0000 mL | Freq: Once | INTRAVENOUS | Status: AC
  Administered 2021-11-11: 1000 mL via INTRAVENOUS

## 2021-11-11 MED ORDER — SENNA 8.6 MG PO TABS: 1.0000 | ORAL_TABLET | Freq: Every day | ORAL | 0 refills | Status: AC

## 2021-11-11 MED ORDER — ONDANSETRON 4 MG PO TBDP: 4.0000 mg | ORAL_TABLET | Freq: Three times a day (TID) | ORAL | 0 refills | Status: AC | PRN

## 2021-11-11 MED ORDER — MORPHINE SULFATE (PF) 4 MG/ML IV SOLN
4.0000 mg | Freq: Once | INTRAVENOUS | Status: AC
  Administered 2021-11-11: 4 mg via INTRAVENOUS
  Filled 2021-11-11: qty 1

## 2021-11-11 MED ORDER — HYDROCODONE-ACETAMINOPHEN 5-325 MG PO TABS: 1.0000 | ORAL_TABLET | Freq: Four times a day (QID) | ORAL | 0 refills | Status: AC | PRN

## 2021-11-11 NOTE — ED Notes (Signed)
Pt verbalizes understanding of discharge instructions. Opportunity for questions and answers were provided. Pt discharged from the ED.   ?

## 2021-11-11 NOTE — ED Provider Notes (Cosign Needed)
[facility] EMERGENCY DEPARTMENT Provider Note   CSN: [id number] Arrival date & time: 11/11/21  5885     History  Chief Complaint  Patient presents with   Flank Pain    Saiquan WALLIS VANCOTT is a 41 y.o. male.  41 year old male with past medical history of nephrolithiasis, hemorrhoids presents today for evaluation of left flank pain of 4-day duration associated with hematuria.  Patient states this morning his pain significantly worsened so he presented to emergency room for evaluation.  He denies dysuria, abdominal pain, or fever.  Endorses nausea but without vomiting.  He has taken ibuprofen with minimal relief.  The history is provided by the patient. No language interpreter was used.      Home Medications Prior to Admission medications   Medication Sig Start Date End Date Taking? Authorizing Provider  cyclobenzaprine (FLEXERIL) 10 MG tablet Take 1 tablet (10 mg total) by mouth 2 (two) times daily as needed for muscle spasms. 01/01/21   Renne Crigler, PA-C  naproxen (NAPROSYN) 500 MG tablet Take 1 tablet (500 mg total) by mouth 2 (two) times daily. 01/01/21   Renne Crigler, PA-C      Allergies    Shellfish allergy    Review of Systems   Review of Systems  Constitutional:  Negative for activity change, chills and fever.  Gastrointestinal:  Negative for abdominal pain, nausea and vomiting.  Genitourinary:  Positive for flank pain and hematuria. Negative for dysuria.  All other systems reviewed and are negative.  Physical Exam Updated Vital Signs BP (!) 129/91 (BP Location: Right Arm)    Pulse (!) 101    Temp 98 F (36.7 C)    Resp 18    Ht 5\' 4"  (1.626 m)    Wt 65.8 kg    SpO2 96%    BMI 24.89 kg/m  Physical Exam Vitals and nursing note reviewed.  Constitutional:      General: He is not in acute distress.    Appearance: Normal appearance. He is not ill-appearing.  HENT:     Head: Normocephalic and atraumatic.     Nose: Nose normal.  Eyes:     General:  No scleral icterus.    Extraocular Movements: Extraocular movements intact.     Conjunctiva/sclera: Conjunctivae normal.  Cardiovascular:     Rate and Rhythm: Regular rhythm. Tachycardia present.     Pulses: Normal pulses.     Heart sounds: Normal heart sounds.  Pulmonary:     Effort: Pulmonary effort is normal. No respiratory distress.     Breath sounds: Normal breath sounds. No wheezing or rales.  Abdominal:     General: There is no distension.     Palpations: Abdomen is soft.     Tenderness: There is no abdominal tenderness. There is left CVA tenderness. There is no right CVA tenderness or guarding.  Musculoskeletal:        General: Normal range of motion.     Cervical back: Normal range of motion.  Skin:    General: Skin is warm and dry.  Neurological:     General: No focal deficit present.     Mental Status: He is alert. Mental status is at baseline.    ED Results / Procedures / Treatments   Labs (all labs ordered are listed, but only abnormal results are displayed) Labs Reviewed  URINALYSIS, ROUTINE W REFLEX MICROSCOPIC - Abnormal; Notable for the following components:      Result Value   APPearance HAZY (*)  Specific Gravity, Urine 1.032 (*)    Hgb urine dipstick LARGE (*)    Ketones, ur 5 (*)    Protein, ur 30 (*)    Leukocytes,Ua TRACE (*)    RBC / HPF >50 (*)    Bacteria, UA FEW (*)    All other components within normal limits  BASIC METABOLIC PANEL - Abnormal; Notable for the following components:   Sodium 132 (*)    Glucose, Bld 138 (*)    Creatinine, Ser 1.38 (*)    All other components within normal limits  CBC - Abnormal; Notable for the following components:   WBC 12.1 (*)    All other components within normal limits    EKG None  Radiology No results found.  Procedures Procedures    Medications Ordered in ED Medications  sodium chloride 0.9 % bolus 1,000 mL (has no administration in time range)  morphine (PF) 4 MG/ML injection 4 mg (has  no administration in time range)  ondansetron (ZOFRAN) injection 4 mg (has no administration in time range)    ED Course/ Medical Decision Making/ A&P                           Medical Decision Making Amount and/or Complexity of Data Reviewed Labs: ordered. Radiology: ordered.  Risk Prescription drug management.   Medical Decision Making / ED Course   This patient presents to the ED for concern of left flank pain now, this involves an extensive number of treatment options, and is a complaint that carries with it a high risk of complications and morbidity.  The differential diagnosis includes pyelonephritis, UTI, diverticulitis, pancreatitis  MDM: 41 year old male with a past medical history of nephro presents today for evaluation of left flank pain with associated hematuria, nausea without vomiting of 4-day duration.  Pain worsened this morning.  He denies fever, dysuria, abdominal pain.  Abdomen benign and nondistended.  CBC with mild leukocytosis BMP with sodium 132, glucose 138 and mild renal insufficiency with creatinine 1.38.  Urinalysis with presence of hemoglobin, trace leukocytes.  Negative nitrates.  Will provide pain control and obtain CT renal study.  Will provide IV hydration. CT renal stone study significant for 6 mm obstructive distal left ureter stone with mild hydronephrosis.  On reevaluation patient with significant improvement in pain following 1 dose of morphine.  Patient does have history of nephrolithiasis and is status post lithotripsy in 2020.  Discussed with Urology who recommends discharge with pain control and Flomax and follow-up in clinic. Plan discussed with patient who voices understanding and is in agreement with plan.  Return precautions discussed.   Additional history obtained: -Additional history obtained from chart review of urology visit back in 2020 where patient required lithotripsy for stone that measured 5 x 7 mm. -External records from outside  source obtained and reviewed including: Chart review including previous notes, labs, imaging, consultation notes   Lab Tests: -I ordered, reviewed, and interpreted labs.   The pertinent results include:   Labs Reviewed  URINALYSIS, ROUTINE W REFLEX MICROSCOPIC - Abnormal; Notable for the following components:      Result Value   APPearance HAZY (*)    Specific Gravity, Urine 1.032 (*)    Hgb urine dipstick LARGE (*)    Ketones, ur 5 (*)    Protein, ur 30 (*)    Leukocytes,Ua TRACE (*)    RBC / HPF >50 (*)    Bacteria, UA FEW (*)  All other components within normal limits  BASIC METABOLIC PANEL - Abnormal; Notable for the following components:   Sodium 132 (*)    Glucose, Bld 138 (*)    Creatinine, Ser 1.38 (*)    All other components within normal limits  CBC - Abnormal; Notable for the following components:   WBC 12.1 (*)    All other components within normal limits      EKG  EKG Interpretation  Date/Time:    Ventricular Rate:    PR Interval:    QRS Duration:   QT Interval:    QTC Calculation:   R Axis:     Text Interpretation:           Imaging Studies ordered: I ordered imaging studies including CT renal I independently visualized and interpreted imaging. I agree with the radiologist interpretation   Medicines ordered and prescription drug management: Meds ordered this encounter  Medications   sodium chloride 0.9 % bolus 1,000 mL   morphine (PF) 4 MG/ML injection 4 mg   ondansetron (ZOFRAN) injection 4 mg    -I have reviewed the patients home medicines and have made adjustments as needed  Critical interventions Pain control, IV hydration  Consultations Obtained: I requested consultation with the urology,  and discussed lab and imaging findings as well as pertinent plan - they recommend: Discharge and follow-up outpatient   Cardiac Monitoring: The patient was maintained on a cardiac monitor.  I personally viewed and interpreted the cardiac  monitored which showed an underlying rhythm of: Normal sinus rhythm  Reevaluation: After the interventions noted above, I reevaluated the patient and found that they have :improved  Co morbidities that complicate the patient evaluation  Past Medical History:  Diagnosis Date   History of kidney stones    Sleep apnea       Dispostion: Patient has significant improvement pain following pain medication.  Patient is appropriate for discharge and follow-up with urology outpatient.     Final Clinical Impression(s) / ED Diagnoses Final diagnoses:  Ureterolithiasis    Rx / DC Orders ED Discharge Orders          Ordered    HYDROcodone-acetaminophen (NORCO/VICODIN) 5-325 MG tablet  Every 6 hours PRN        11/11/21 0945    tamsulosin (FLOMAX) 0.4 MG CAPS capsule  Daily        11/11/21 0945    ondansetron (ZOFRAN-ODT) 4 MG disintegrating tablet  Every 8 hours PRN        11/11/21 0945    senna (SENOKOT) 8.6 MG TABS tablet  Daily        11/11/21 0945              Marita [place], PA-C 11/11/21 [id number]

## 2021-11-11 NOTE — Discharge Instructions (Addendum)
Your CT scan showed you have a 6 mm stone on the left.  He had good pain control with medication in the emergency room.  I have sent an additional medication to the pharmacy for you along with and Zofran.  I have also attached urology information above for you.  Please call them to schedule a follow-up appointment.  If you develop fever, worsening symptoms please return to the emergency room for evaluation. ?

## 2021-11-11 NOTE — ED Triage Notes (Signed)
Left flank pain and hematuria x 4 days.  ?

## 2021-11-11 NOTE — ED Notes (Signed)
Patient transported to CT 

## 2022-03-21 ENCOUNTER — Other Ambulatory Visit: Payer: Self-pay | Admitting: Physician Assistant

## 2022-03-21 DIAGNOSIS — R109 Unspecified abdominal pain: Secondary | ICD-10-CM

## 2022-04-19 ENCOUNTER — Other Ambulatory Visit: Payer: Medicaid Other

## 2022-06-20 ENCOUNTER — Other Ambulatory Visit: Payer: Self-pay | Admitting: Physician Assistant

## 2022-06-20 DIAGNOSIS — R109 Unspecified abdominal pain: Secondary | ICD-10-CM

## 2022-07-01 ENCOUNTER — Ambulatory Visit
Admission: RE | Admit: 2022-07-01 | Discharge: 2022-07-01 | Disposition: A | Discharge status: Home / Self Care | Payer: Medicaid Other | Source: Ambulatory Visit | Attending: Physician Assistant | Admitting: Physician Assistant

## 2022-07-01 DIAGNOSIS — R109 Unspecified abdominal pain: Secondary | ICD-10-CM

## 2023-05-14 ENCOUNTER — Other Ambulatory Visit: Payer: Self-pay | Admitting: Physician Assistant

## 2023-05-14 DIAGNOSIS — N390 Urinary tract infection, site not specified: Secondary | ICD-10-CM

## 2023-05-23 ENCOUNTER — Other Ambulatory Visit: Payer: Medicaid Other

## 2023-06-03 ENCOUNTER — Ambulatory Visit
Admission: RE | Admit: 2023-06-03 | Discharge: 2023-06-03 | Disposition: A | Discharge status: Home / Self Care | Payer: Medicaid Other | Source: Ambulatory Visit | Attending: Physician Assistant | Admitting: Physician Assistant

## 2023-06-03 ENCOUNTER — Other Ambulatory Visit: Payer: Medicaid Other

## 2023-06-03 DIAGNOSIS — N390 Urinary tract infection, site not specified: Secondary | ICD-10-CM

## 2023-06-03 MED ORDER — IOPAMIDOL (ISOVUE-300) INJECTION 61%
100.0000 mL | Freq: Once | INTRAVENOUS | Status: AC | PRN
  Administered 2023-06-03: 100 mL via INTRAVENOUS

## 2023-08-01 IMAGING — CT CT RENAL STONE PROTOCOL
2 of 4 series · 16 of 46 positions shown, 18 images · non-contrast
Comparison: CT the abdomen and pelvis 05/03/2019.

CLINICAL DATA: 40-year-old male with history of left-sided flank
pain and hematuria for the past 4 days.



[Series 3: renal stone 5.0 · axial · 0.73mm/px · z∈[-1052,-582]mm · 13 of 104 slices shown, 15 images]
[im 5/104  soft-tissue]
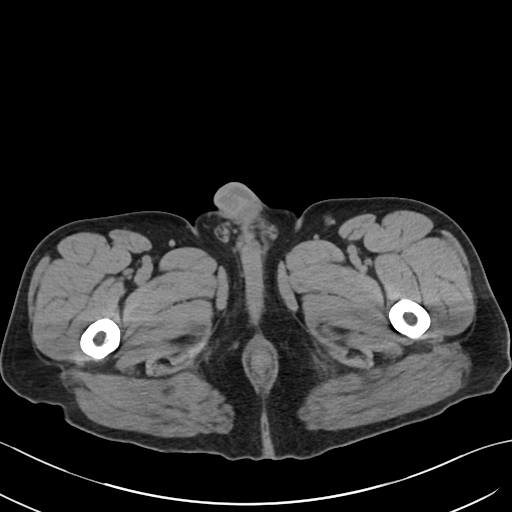
[im 5/104  bone]
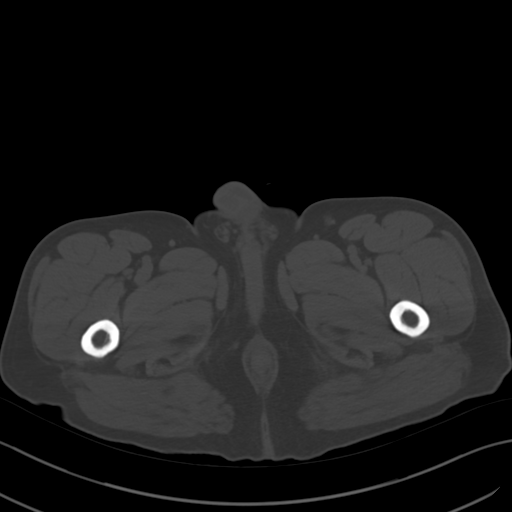
[im 13/104  soft-tissue]
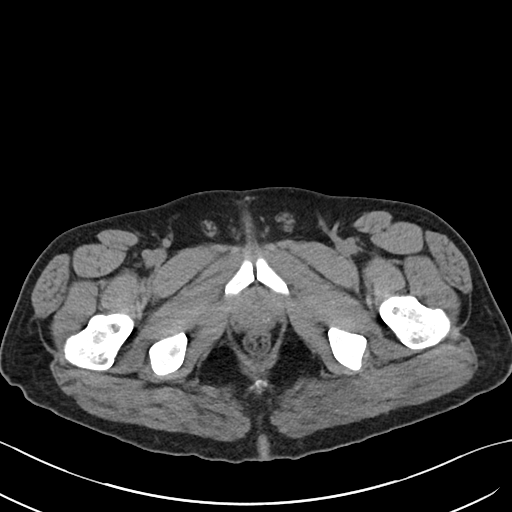
[im 21/104  soft-tissue]
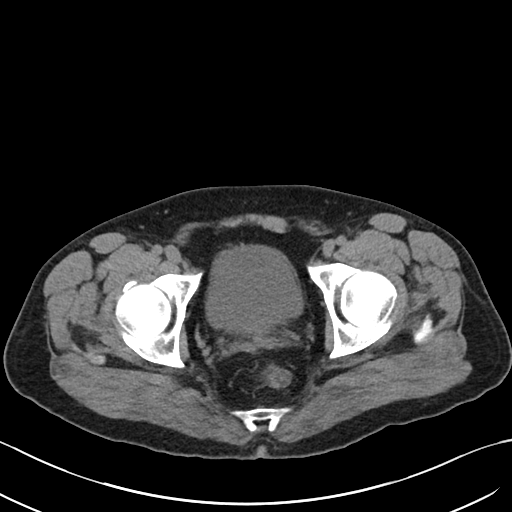
[im 29/104  soft-tissue]
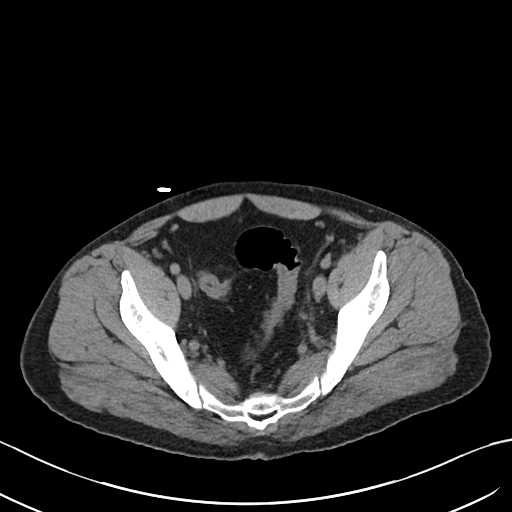
[im 38/104  soft-tissue]
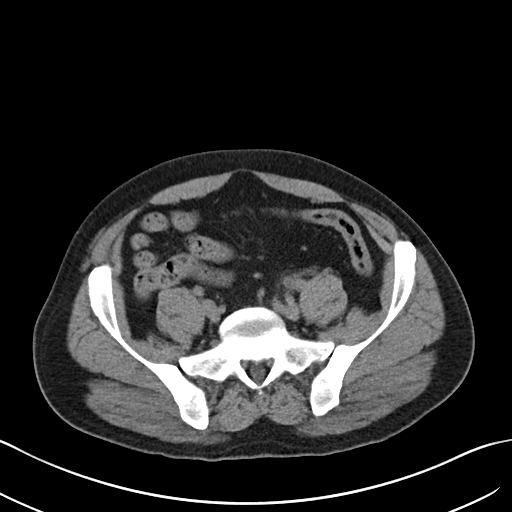
[im 46/104  soft-tissue]
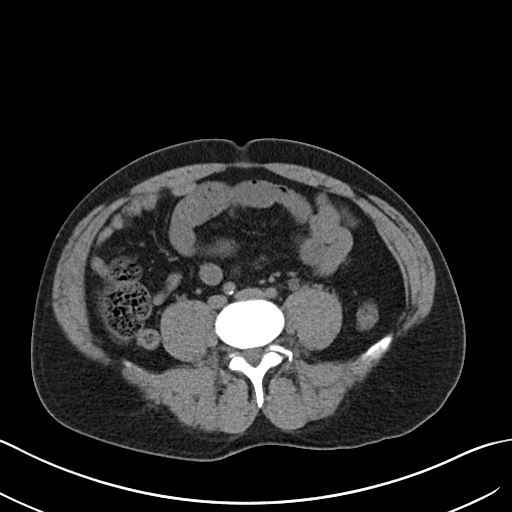
[im 54/104  soft-tissue]
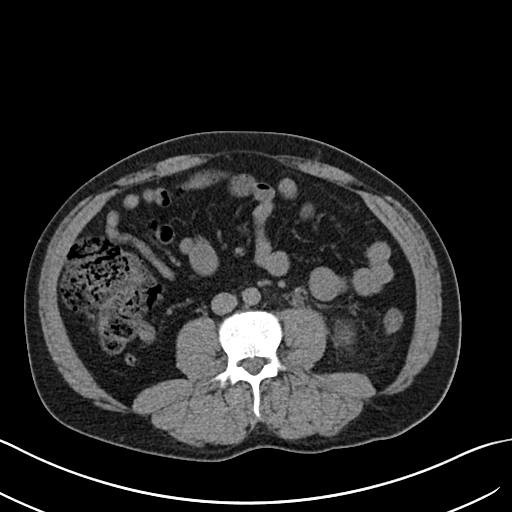
[im 58/104  soft-tissue]
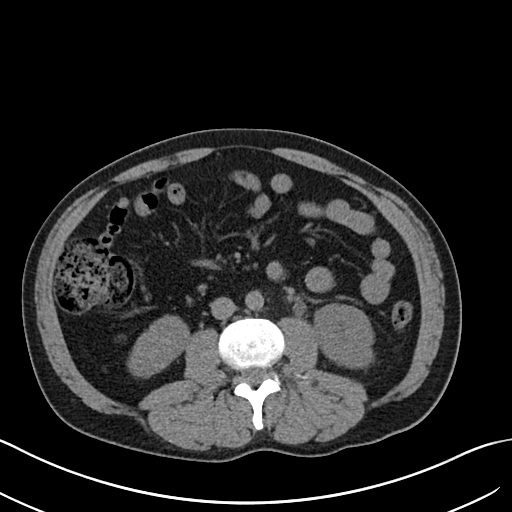
[im 66/104  soft-tissue]
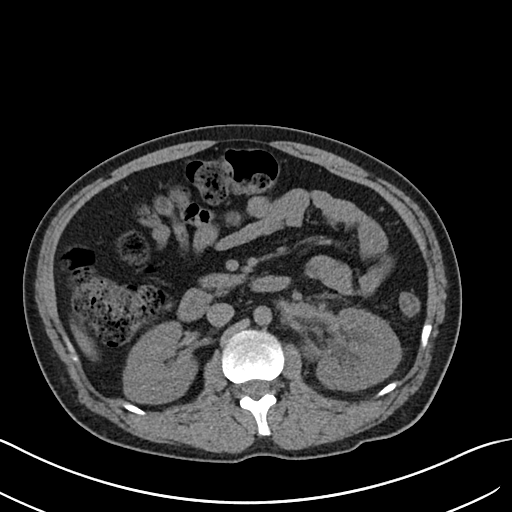
[im 66/104  bone]
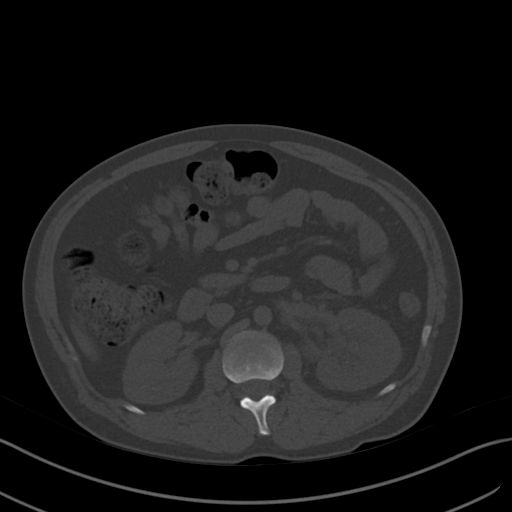
[im 75/104  soft-tissue]
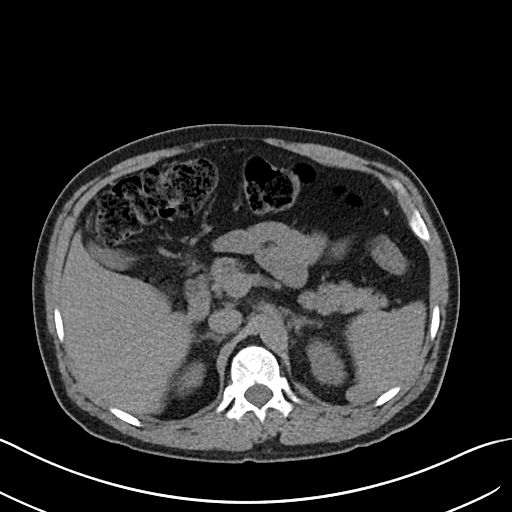
[im 83/104  soft-tissue]
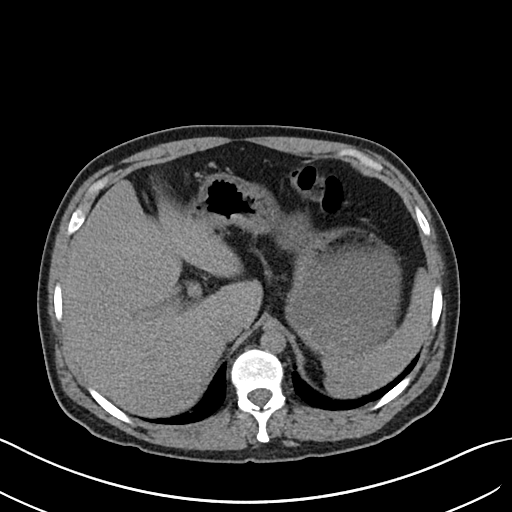
[im 91/104  soft-tissue]
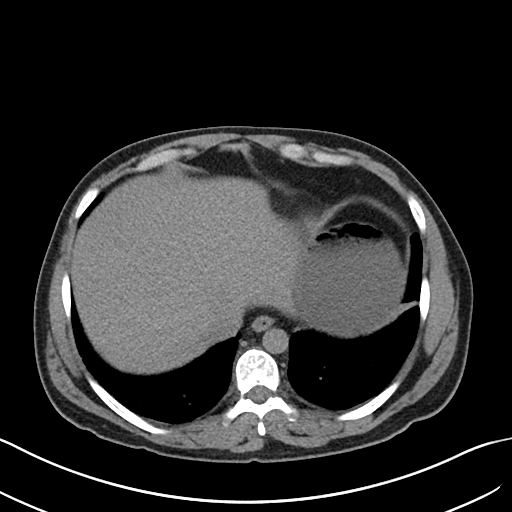
[im 99/104  soft-tissue]
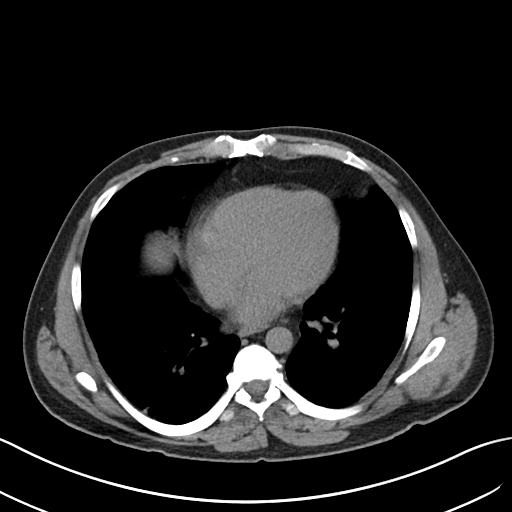

[Series 6: cor · coronal · 0.84mm/px · 3 of 132 slices shown]
[im 44/132  soft-tissue]
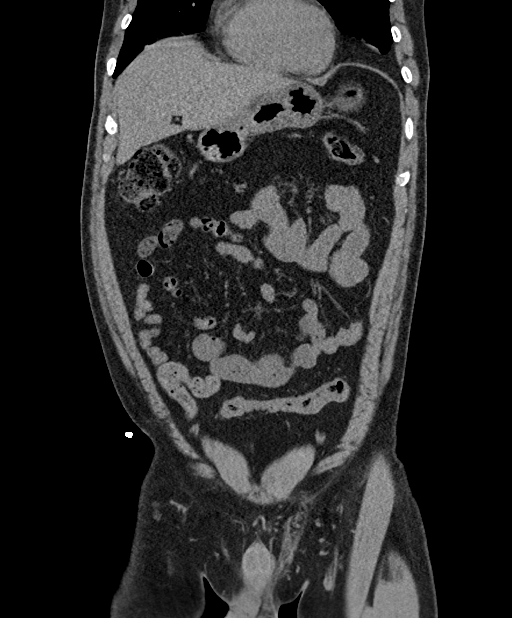
[im 59/132  soft-tissue]
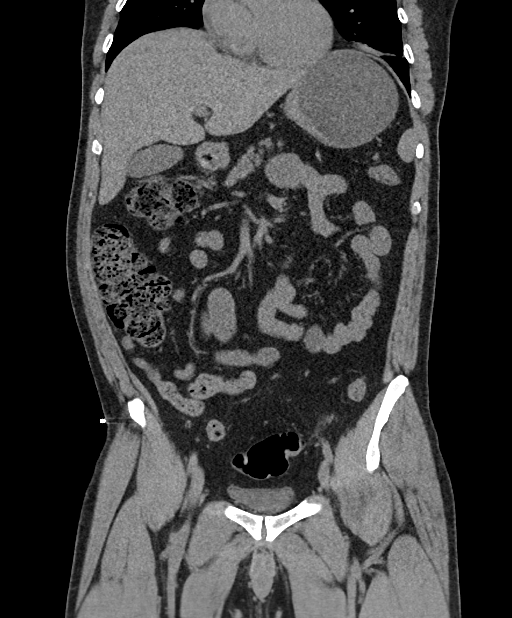
[im 73/132  soft-tissue]
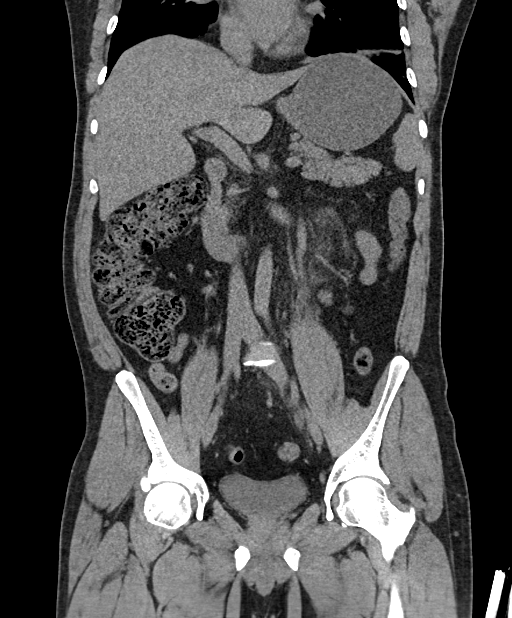

[16 of 46 positions shown; findings below may reference images not displayed]

FINDINGS: Lower chest: Unremarkable.

Hepatobiliary: No definite suspicious cystic or solid hepatic
lesions are confidently identified on today's noncontrast CT
examination. Unenhanced appearance of the gallbladder is normal.

Pancreas: No definite pancreatic mass or peripancreatic fluid
collections or inflammatory changes are noted on today's noncontrast
CT examination.

Spleen: Unremarkable.

Adrenals/Urinary Tract: In the distal third of the left ureter
(axial image 75 of series 3) there is a 6 mm calculus. This is
associated with mild proximal left hydroureteronephrosis along with
some mild left perinephric and periureteric soft tissue stranding.
Unenhanced appearance of the kidneys and bilateral adrenal glands is
otherwise normal. No right hydroureteronephrosis. Unenhanced
appearance of the urinary bladder is normal.

Stomach/Bowel: Unenhanced appearance of the stomach is normal. There
is no pathologic dilatation of small bowel or colon. Normal
appendix.

Vascular/Lymphatic: Atherosclerosis in the pelvic vasculature. No
lymphadenopathy noted in the abdomen or pelvis.

Reproductive: Prostate gland and seminal vesicles are unremarkable
in appearance.

Other: No significant volume of ascites.  No pneumoperitoneum.

Musculoskeletal: There are no aggressive appearing lytic or blastic
lesions noted in the visualized portions of the skeleton.
IMPRESSION: 1. 6 mm obstructive calculus in the distal third of the left ureter
with mild proximal left hydroureteronephrosis.
2. Atherosclerosis.
